# Patient Record
Sex: Male | Born: 1990 | Race: White | Hispanic: No | Marital: Married
Health system: Southern US, Community
[De-identification: ages and names within clinical notes are randomized; demographics above are authoritative.]

---

## 2021-07-14 ENCOUNTER — Emergency Department (HOSPITAL_COMMUNITY): Payer: Self-pay

## 2021-07-14 ENCOUNTER — Emergency Department (HOSPITAL_COMMUNITY)
Admission: EM | Admit: 2021-07-14 | Discharge: 2021-07-14 | Disposition: A | Discharge status: Home / Self Care | Payer: Self-pay | Attending: Emergency Medicine | Admitting: Emergency Medicine

## 2021-07-14 ENCOUNTER — Encounter (HOSPITAL_COMMUNITY): Payer: Self-pay | Admitting: Emergency Medicine

## 2021-07-14 ENCOUNTER — Other Ambulatory Visit: Payer: Self-pay

## 2021-07-14 DIAGNOSIS — X501XXA Overexertion from prolonged static or awkward postures, initial encounter: Secondary | ICD-10-CM | POA: Insufficient documentation

## 2021-07-14 DIAGNOSIS — S93402A Sprain of unspecified ligament of left ankle, initial encounter: Secondary | ICD-10-CM | POA: Insufficient documentation

## 2021-07-14 DIAGNOSIS — Y9301 Activity, walking, marching and hiking: Secondary | ICD-10-CM | POA: Insufficient documentation

## 2021-07-14 NOTE — ED Triage Notes (Signed)
Pt states they have L ankle pain. Rolled it in April and went to [facility]. They rolled it again yesterday. It is swollen. Pt can bear weight

## 2021-07-14 NOTE — Discharge Instructions (Addendum)
Use the brace on your ankle.  Consider following up with an orthopedic doctor for further evaluation as we discussed

## 2021-07-14 NOTE — ED Provider Notes (Signed)
[facility]-EMERGENCY DEPT Provider Note   CSN: [id number] Arrival date & time: 07/14/21  1915     History Chief Complaint  Patient presents with   Ankle Pain    Keith Hopkins is a 30 y.o. male.   Ankle Pain  Patient presented to the ED for evaluation of left ankle pain.  Patient states he has history of prior ankle injury on that same side but he recovered completely.  Patient was walking outside when he accidentally twisted his ankle.  Patient was able to work today and put weight on his ankle but the pain has increased throughout the day.  He has noticed more swelling.  He also noticed oozing so he came to the ED.  No foot pain.  No knee pain  History reviewed. No pertinent past medical history.  There are no problems to display for this patient.   History reviewed. No pertinent surgical history.     History reviewed. No pertinent family history.     Home Medications Prior to Admission medications   Not on File    Allergies    Patient has no known allergies.  Review of Systems   Review of Systems  All other systems reviewed and are negative.  Physical Exam Updated Vital Signs BP (!) 149/100 (BP Location: Right Arm)   Pulse 94   Temp 98 F (36.7 C) (Oral)   Resp 18   Ht 1.753 m (5\' 9" )   Wt 106.6 kg   SpO2 99%   BMI 34.70 kg/m   Physical Exam Vitals and nursing note reviewed.  Constitutional:      General: He is not in acute distress.    Appearance: He is well-developed.  HENT:     Head: Normocephalic and atraumatic.     Right Ear: External ear normal.     Left Ear: External ear normal.  Eyes:     General: No scleral icterus.       Right eye: No discharge.        Left eye: No discharge.     Conjunctiva/sclera: Conjunctivae normal.  Neck:     Trachea: No tracheal deviation.  Cardiovascular:     Rate and Rhythm: Normal rate.  Pulmonary:     Effort: Pulmonary effort is normal. No respiratory distress.     Breath sounds:  No stridor.  Abdominal:     General: There is no distension.  Musculoskeletal:        General: Swelling, tenderness and deformity present.     Cervical back: Neck supple.     Left ankle: Swelling and ecchymosis present. Tenderness present over the lateral malleolus. No base of 5th metatarsal or proximal fibula tenderness.     Left Achilles Tendon: Normal.  Skin:    General: Skin is warm and dry.     Findings: No rash.  Neurological:     Mental Status: He is alert.     Cranial Nerves: Cranial nerve deficit: no gross deficits.    ED Results / Procedures / Treatments   Labs (all labs ordered are listed, but only abnormal results are displayed) Labs Reviewed - No data to display  EKG None  Radiology DG Ankle Complete Left  Result Date: 07/14/2021 CLINICAL DATA:  Status post trauma. EXAM: LEFT ANKLE COMPLETE - 3+ VIEW COMPARISON:  None. FINDINGS: There is no evidence of fracture, dislocation, or joint effusion. There is no evidence of arthropathy or other focal bone abnormality. Marked severity lateral soft tissue swelling is  seen. IMPRESSION: Lateral soft tissue swelling without an acute osseous abnormality. Electronically Signed   By: Aram Candela M.D.   On: 07/14/2021 21:49    Procedures Procedures   Medications Ordered in ED Medications - No data to display  ED Course  I have reviewed the triage vital signs and the nursing notes.  Pertinent labs & imaging results that were available during my care of the patient were reviewed by me and considered in my medical decision making (see chart for details).    MDM Rules/Calculators/A&P                           Patient presented to the ED for evaluation of an ankle injury.  Patient has obvious ecchymoses below the lateral malleolus.  X-ray does not show signs of fracture.  Presentation is consistent with a severe sprain.  Patient had a previous sprained the same ankle.  We will have him follow-up with orthopedics as he may  have some instability.  Discharge home with an ASO brace Final Clinical Impression(s) / ED Diagnoses Final diagnoses:  Sprain of left ankle, unspecified ligament, initial encounter    Rx / DC Orders ED Discharge Orders     None        Linwood Dibbles, MD 07/14/21 2214

## 2022-09-01 IMAGING — CR DG ANKLE COMPLETE 3+V*L*
3 series · 3 of 3 positions shown · non-contrast
Comparison: None.

CLINICAL DATA: Status post trauma.

EXAM:
LEFT ANKLE COMPLETE - 3+ VIEW

[x ankle ap left]
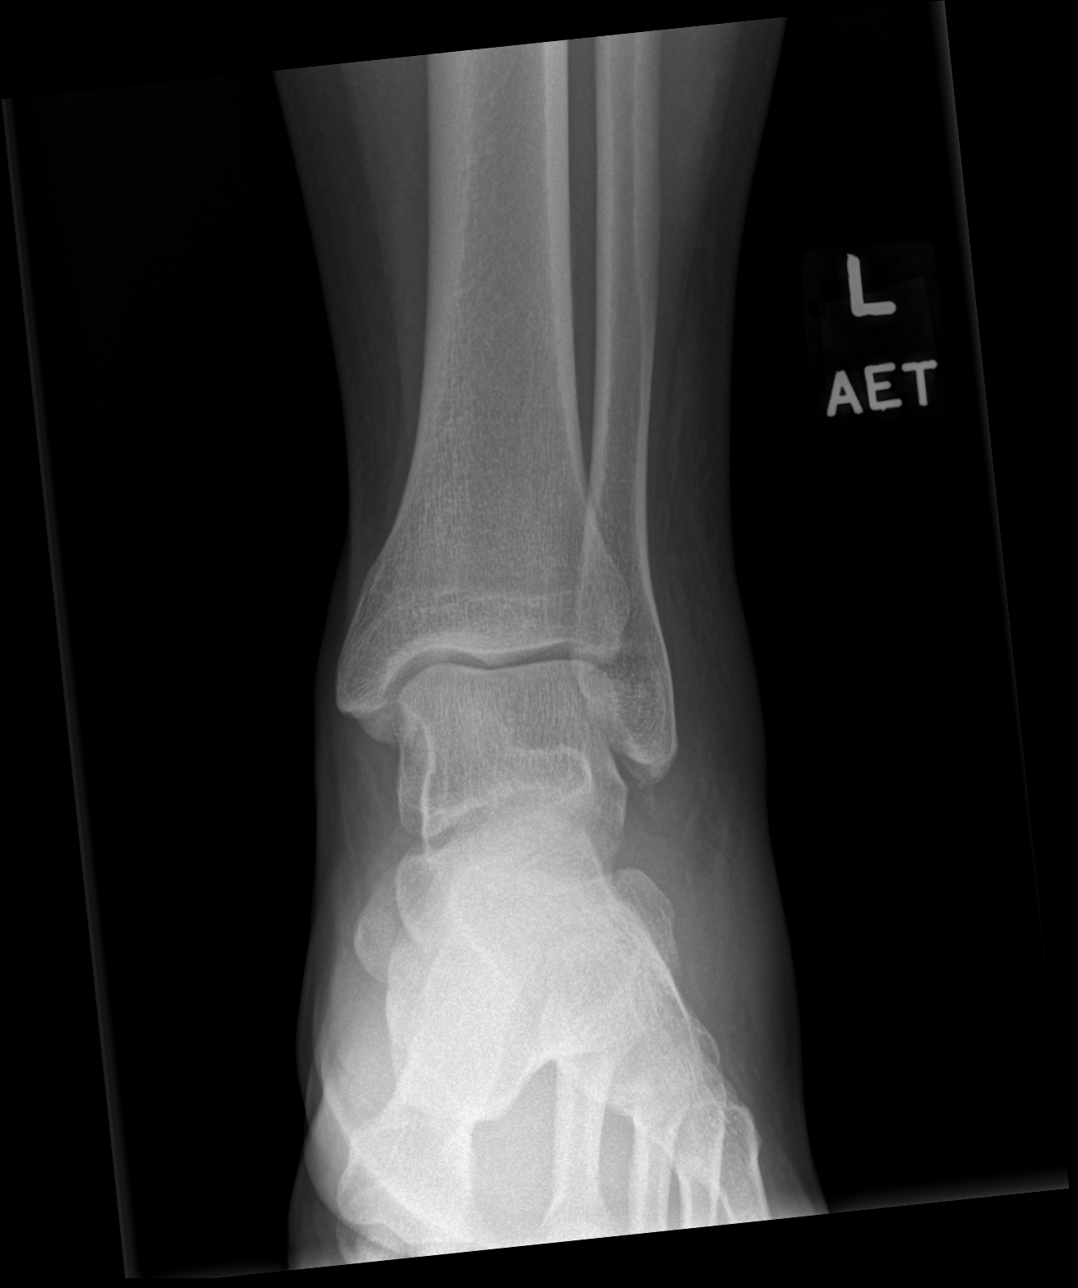

[x ankle obl left]
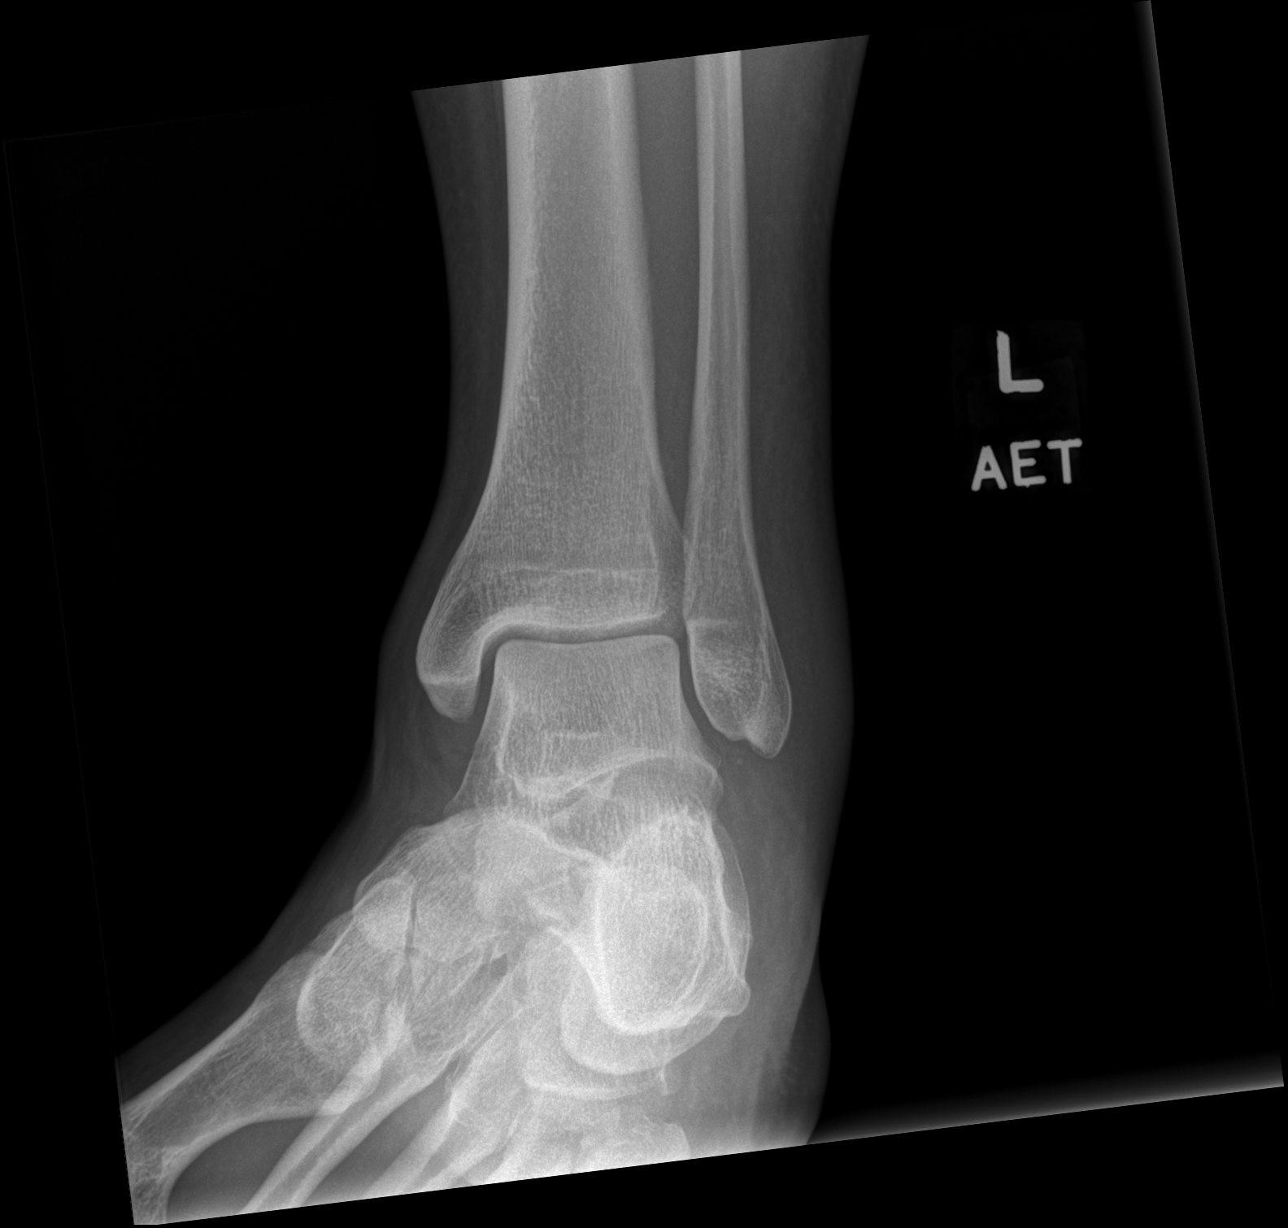

[x ankle lat left]
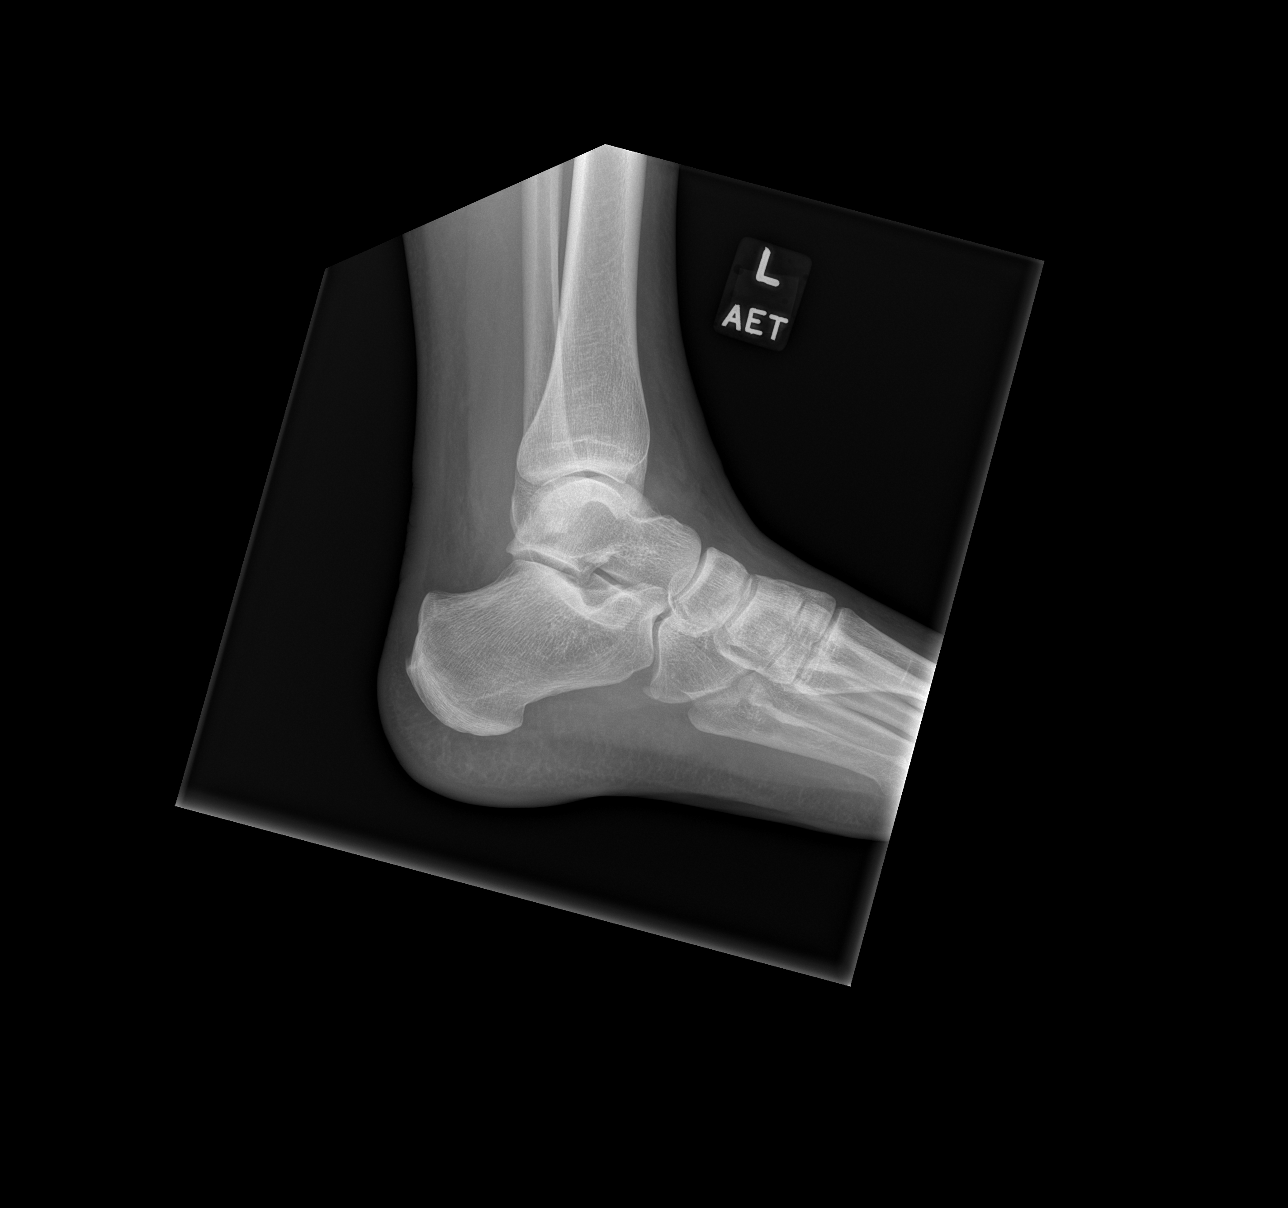

[3 of 3 positions shown; findings below may reference images not displayed]

FINDINGS: There is no evidence of fracture, dislocation, or joint effusion.
There is no evidence of arthropathy or other focal bone abnormality.
Marked severity lateral soft tissue swelling is seen.
IMPRESSION: Lateral soft tissue swelling without an acute osseous abnormality.
